# Patient Record
Sex: Male | Born: 1975 | Race: White | Hispanic: No | Marital: Married | Smoking: Never smoker
Health system: Southern US, Community
[De-identification: ages and names within clinical notes are randomized; demographics above are authoritative.]

## PROBLEM LIST (undated history)

## (undated) DIAGNOSIS — I10 Essential (primary) hypertension: Secondary | ICD-10-CM

---

## 2015-11-13 ENCOUNTER — Encounter (HOSPITAL_COMMUNITY): Payer: Self-pay | Admitting: Emergency Medicine

## 2015-11-13 ENCOUNTER — Emergency Department (HOSPITAL_COMMUNITY)
Admission: EM | Admit: 2015-11-13 | Discharge: 2015-11-13 | Disposition: A | Discharge status: Home / Self Care | Payer: No Typology Code available for payment source | Attending: Emergency Medicine | Admitting: Emergency Medicine

## 2015-11-13 ENCOUNTER — Emergency Department (HOSPITAL_COMMUNITY): Payer: No Typology Code available for payment source

## 2015-11-13 DIAGNOSIS — Y999 Unspecified external cause status: Secondary | ICD-10-CM | POA: Diagnosis not present

## 2015-11-13 DIAGNOSIS — R0789 Other chest pain: Secondary | ICD-10-CM | POA: Diagnosis not present

## 2015-11-13 DIAGNOSIS — I10 Essential (primary) hypertension: Secondary | ICD-10-CM | POA: Diagnosis not present

## 2015-11-13 DIAGNOSIS — M545 Low back pain, unspecified: Secondary | ICD-10-CM

## 2015-11-13 DIAGNOSIS — Y92410 Unspecified street and highway as the place of occurrence of the external cause: Secondary | ICD-10-CM | POA: Diagnosis not present

## 2015-11-13 DIAGNOSIS — Y9389 Activity, other specified: Secondary | ICD-10-CM | POA: Diagnosis not present

## 2015-11-13 DIAGNOSIS — R1032 Left lower quadrant pain: Secondary | ICD-10-CM

## 2015-11-13 HISTORY — DX: Essential (primary) hypertension: I10

## 2015-11-13 LAB — CBC WITH DIFFERENTIAL/PLATELET
BASOS ABS: 0 10*3/uL (ref 0.0–0.1)
Basophils Relative: 0 %
EOS PCT: 0 %
Eosinophils Absolute: 0 10*3/uL (ref 0.0–0.7)
HCT: 44 % (ref 39.0–52.0)
Hemoglobin: 14.8 g/dL (ref 13.0–17.0)
LYMPHS PCT: 19 %
Lymphs Abs: 1.7 10*3/uL (ref 0.7–4.0)
MCH: 29.4 pg (ref 26.0–34.0)
MCHC: 33.6 g/dL (ref 30.0–36.0)
MCV: 87.3 fL (ref 78.0–100.0)
MONO ABS: 0.7 10*3/uL (ref 0.1–1.0)
MONOS PCT: 8 %
Neutro Abs: 6.6 10*3/uL (ref 1.7–7.7)
Neutrophils Relative %: 73 %
PLATELETS: 212 10*3/uL (ref 150–400)
RBC: 5.04 MIL/uL (ref 4.22–5.81)
RDW: 12.8 % (ref 11.5–15.5)
WBC: 9 10*3/uL (ref 4.0–10.5)

## 2015-11-13 LAB — BASIC METABOLIC PANEL
Anion gap: 7 (ref 5–15)
BUN: 16 mg/dL (ref 6–20)
CALCIUM: 9.2 mg/dL (ref 8.9–10.3)
CO2: 26 mmol/L (ref 22–32)
Chloride: 106 mmol/L (ref 101–111)
Creatinine, Ser: 1.4 mg/dL — ABNORMAL HIGH (ref 0.61–1.24)
GFR calc Af Amer: >60 mL/min (ref >60)
GLUCOSE: 105 mg/dL — AB (ref 65–99)
POTASSIUM: 4.7 mmol/L (ref 3.5–5.1)
Sodium: 139 mmol/L (ref 135–145)

## 2015-11-13 LAB — I-STAT TROPONIN, ED: TROPONIN I, POC: 0 ng/mL (ref 0.00–0.08)

## 2015-11-13 MED ORDER — HYDROCODONE-ACETAMINOPHEN 5-325 MG PO TABS: 1.0000 | ORAL_TABLET | Freq: Four times a day (QID) | ORAL | 0 refills | Status: AC | PRN

## 2015-11-13 MED ORDER — OXYCODONE-ACETAMINOPHEN 5-325 MG PO TABS
1.0000 | ORAL_TABLET | Freq: Once | ORAL | Status: AC
  Administered 2015-11-13: 1 via ORAL
  Filled 2015-11-13: qty 1

## 2015-11-13 MED ORDER — MORPHINE SULFATE (PF) 4 MG/ML IV SOLN
4.0000 mg | Freq: Once | INTRAVENOUS | Status: AC
  Administered 2015-11-13: 4 mg via INTRAVENOUS
  Filled 2015-11-13: qty 1

## 2015-11-13 MED ORDER — HYDROMORPHONE HCL 1 MG/ML IJ SOLN: 0.5000 mg | Freq: Once | INTRAMUSCULAR | Status: DC

## 2015-11-13 MED ORDER — ONDANSETRON HCL 4 MG/2ML IJ SOLN
4.0000 mg | Freq: Once | INTRAMUSCULAR | Status: AC
  Administered 2015-11-13: 4 mg via INTRAVENOUS
  Filled 2015-11-13: qty 2

## 2015-11-13 MED ORDER — SODIUM CHLORIDE 0.9 % IV BOLUS (SEPSIS)
1000.0000 mL | Freq: Once | INTRAVENOUS | Status: AC
  Administered 2015-11-13: 1000 mL via INTRAVENOUS

## 2015-11-13 MED ORDER — HYDROMORPHONE HCL 1 MG/ML IJ SOLN
1.0000 mg | Freq: Once | INTRAMUSCULAR | Status: AC
  Administered 2015-11-13: 1 mg via INTRAVENOUS
  Filled 2015-11-13: qty 1

## 2015-11-13 MED ORDER — IOPAMIDOL (ISOVUE-300) INJECTION 61%
100.0000 mL | Freq: Once | INTRAVENOUS | Status: AC | PRN
  Administered 2015-11-13: 100 mL via INTRAVENOUS

## 2015-11-13 MED ORDER — CYCLOBENZAPRINE HCL 10 MG PO TABS: 10.0000 mg | ORAL_TABLET | Freq: Two times a day (BID) | ORAL | 0 refills | Status: AC | PRN
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
## 2015-11-13 NOTE — Discharge Instructions (Signed)
Please take the Norco as needed and as prescribed for pain. You may also take the Flexeril for muscle spasm. Please follow-up with orthopedic referral for compression fracture. He also developed a primary care doctor for all other findings that were noted and discussed today. Give plenty of rest and use heating pad for pain. Please return to the ED for worsening pain, exertional chest pain, shortness of breath or for any reason.
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
## 2015-11-13 NOTE — ED Notes (Signed)
Pt's wife at bedside, came to nursig station and requesting this RN to put chest pain protocol so pt can have pain medicine. This Clinical research associatewriter explained that pt reported lower back pain and abd pain and no chest pain. Also added that if pt is having chest pain , I will inform provider and will administer medicine per provider orders only.  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 The writer called provider immediately and made aware about the wife's  request . Provider in route to examine pt.
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
